# Patient Record
Sex: Female | Born: 2005
Health system: Southern US, Community
[De-identification: ages and names within clinical notes are randomized; demographics above are authoritative.]

## PROBLEM LIST (undated history)

## (undated) DIAGNOSIS — R918 Other nonspecific abnormal finding of lung field: Secondary | ICD-10-CM

## (undated) DIAGNOSIS — I3139 Other pericardial effusion (noninflammatory): Secondary | ICD-10-CM

---
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
## 2024-02-11 ENCOUNTER — Other Ambulatory Visit: Payer: Self-pay

## 2024-02-11 ENCOUNTER — Ambulatory Visit

## 2024-02-11 ENCOUNTER — Ambulatory Visit
Admission: RE | Admit: 2024-02-11 | Discharge: 2024-02-11 | Disposition: A | Discharge status: Home / Self Care | Source: Ambulatory Visit | Attending: Physician Assistant | Admitting: Physician Assistant

## 2024-02-11 VITALS — BP 115/70 | HR 68 | Temp 98.4°F | Resp 16

## 2024-02-11 DIAGNOSIS — J40 Bronchitis, not specified as acute or chronic: Secondary | ICD-10-CM | POA: Diagnosis not present

## 2024-02-11 DIAGNOSIS — R0989 Other specified symptoms and signs involving the circulatory and respiratory systems: Secondary | ICD-10-CM

## 2024-02-11 HISTORY — DX: Other nonspecific abnormal finding of lung field: R91.8

## 2024-02-11 HISTORY — DX: Other pericardial effusion (noninflammatory): I31.39

## 2024-02-11 MED ORDER — PREDNISONE 20 MG PO TABS: ORAL_TABLET | ORAL | 0 refills | Status: AC

## 2024-02-11 NOTE — ED Triage Notes (Signed)
 C/o waking up one morning and having lung heaviness, has had cough (x 3-4 days). Was sick recently per patient. Reports she has hx of pericardial effusion and is unsure this is related. No fever. Has been taking ibuprofen and tylenol.
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
## 2024-02-11 NOTE — Discharge Instructions (Addendum)
 Your chest x-ray was negative for signs of pneumonia.  Your heart outline appears to be normal size and does not show sign of inflammation. At this time I recommend that we start the steroids as discussed to help with your breathing. I have sent in a script for Prednisone taper to be taken in the morning with breakfast per the instructions on the container Remember that steroids can cause sleeplessness, irritability, increased hunger and elevated glucose levels so be mindful of these side effects. They should lessen as you progress to the lower doses of the taper. This should help reduce inflammation and help you breathe a little bit easier.  If you feel like your symptoms are persisting or getting worse I recommend following up with your primary care provider or you can return to urgent care for further evaluation management.  If you feel like your symptoms are getting worse and you have significant trouble breathing, fevers that are not responding to over-the-counter medication, fatigue or passing out, confusion please go to the emergency room as these could be signs of a medical emergency.
